# Patient Record
Sex: Male | Born: 2000 | Race: Black or African American | Hispanic: No | Marital: Single | Smoking: Never smoker
Health system: Southern US, Community
[De-identification: ages and names within clinical notes are randomized; demographics above are authoritative.]

## PROBLEM LIST (undated history)

## (undated) DIAGNOSIS — S42409A Unspecified fracture of lower end of unspecified humerus, initial encounter for closed fracture: Secondary | ICD-10-CM

## (undated) DIAGNOSIS — S6990XA Unspecified injury of unspecified wrist, hand and finger(s), initial encounter: Secondary | ICD-10-CM

## (undated) DIAGNOSIS — T1491XA Suicide attempt, initial encounter: Secondary | ICD-10-CM

## (undated) DIAGNOSIS — F1290 Cannabis use, unspecified, uncomplicated: Secondary | ICD-10-CM

## (undated) DIAGNOSIS — F32A Depression, unspecified: Secondary | ICD-10-CM

## (undated) DIAGNOSIS — R4689 Other symptoms and signs involving appearance and behavior: Secondary | ICD-10-CM

## (undated) DIAGNOSIS — F515 Nightmare disorder: Secondary | ICD-10-CM

## (undated) DIAGNOSIS — F329 Major depressive disorder, single episode, unspecified: Secondary | ICD-10-CM

## (undated) HISTORY — DX: Other symptoms and signs involving appearance and behavior: R46.89

## (undated) HISTORY — DX: Unspecified injury of unspecified wrist, hand and finger(s), initial encounter: S69.90XA

## (undated) HISTORY — DX: Major depressive disorder, single episode, unspecified: F32.9

## (undated) HISTORY — DX: Unspecified fracture of lower end of unspecified humerus, initial encounter for closed fracture: S42.409A

## (undated) HISTORY — DX: Suicide attempt, initial encounter: T14.91XA

## (undated) HISTORY — DX: Depression, unspecified: F32.A

## (undated) HISTORY — PX: CIRCUMCISION: SHX1350

## (undated) HISTORY — DX: Cannabis use, unspecified, uncomplicated: F12.90

## (undated) HISTORY — DX: Nightmare disorder: F51.5

---
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
## 2018-05-10 ENCOUNTER — Encounter (HOSPITAL_COMMUNITY): Payer: Self-pay

## 2018-05-10 ENCOUNTER — Emergency Department (HOSPITAL_COMMUNITY)
Admission: EM | Admit: 2018-05-10 | Discharge: 2018-05-11 | Disposition: A | Discharge status: Home / Self Care | Payer: Medicaid Other | Attending: Emergency Medicine | Admitting: Emergency Medicine

## 2018-05-10 DIAGNOSIS — J039 Acute tonsillitis, unspecified: Secondary | ICD-10-CM | POA: Insufficient documentation

## 2018-05-10 DIAGNOSIS — Z79899 Other long term (current) drug therapy: Secondary | ICD-10-CM | POA: Insufficient documentation

## 2018-05-10 DIAGNOSIS — J029 Acute pharyngitis, unspecified: Secondary | ICD-10-CM | POA: Diagnosis present

## 2018-05-10 DIAGNOSIS — Z7982 Long term (current) use of aspirin: Secondary | ICD-10-CM | POA: Diagnosis not present

## 2018-05-10 DIAGNOSIS — Z113 Encounter for screening for infections with a predominantly sexual mode of transmission: Secondary | ICD-10-CM

## 2018-05-10 LAB — URINALYSIS, ROUTINE W REFLEX MICROSCOPIC
BACTERIA UA: NONE SEEN
GLUCOSE, UA: NEGATIVE mg/dL
Hgb urine dipstick: NEGATIVE
KETONES UR: NEGATIVE mg/dL
Leukocytes, UA: NEGATIVE
NITRITE: NEGATIVE
Protein, ur: 100 mg/dL — AB
Specific Gravity, Urine: 1.042 — ABNORMAL HIGH (ref 1.005–1.030)
pH: 5 (ref 5.0–8.0)

## 2018-05-10 LAB — GROUP A STREP BY PCR: Group A Strep by PCR: NOT DETECTED

## 2018-05-10 MED ORDER — LIDOCAINE VISCOUS HCL 2 % MT SOLN
15.0000 mL | Freq: Once | OROMUCOSAL | Status: AC
Start: 2018-05-10 — End: 2018-05-10
  Administered 2018-05-10: 15 mL via OROMUCOSAL
  Filled 2018-05-10: qty 15

## 2018-05-10 MED ORDER — CEFTRIAXONE SODIUM 250 MG IJ SOLR
250.0000 mg | Freq: Once | INTRAMUSCULAR | Status: AC
  Administered 2018-05-10: 250 mg via INTRAMUSCULAR
  Filled 2018-05-10: qty 250

## 2018-05-10 MED ORDER — AZITHROMYCIN 250 MG PO TABS
1000.0000 mg | ORAL_TABLET | Freq: Once | ORAL | Status: AC
  Administered 2018-05-10: 1000 mg via ORAL
  Filled 2018-05-10: qty 4

## 2018-05-10 MED ORDER — STERILE WATER FOR INJECTION IJ SOLN
INTRAMUSCULAR | Status: AC
  Administered 2018-05-10: 23:00:00
  Filled 2018-05-10: qty 10

## 2018-05-10 NOTE — ED Triage Notes (Signed)
Onset 3 days sore throat, white patches on back of throat on right side.

## 2018-05-10 NOTE — ED Provider Notes (Signed)
[facility] EMERGENCY DEPARTMENT Provider Note  CSN: [id number] Arrival date & time: 05/10/18  2022    History   Chief Complaint Chief Complaint  Patient presents with  . Sore Throat    HPI Philip Mccarthy is a 17 y.o. male with no significant medical history who presented to the ED for sore throat. Associated symptoms include right ear pain, pain while swallowing and white spots in throat. Onset of symptoms was 3 days ago, stable since that time.  He is drinking moderate amounts of fluids. Unknown sick contacts. Patient has been taking over-the-counter cold/flu products with no relief. Denies fever, chills, neck pain, skin rash, dysphagia, change in speech, drooling or trismus.  Patient also requests STI testing and treatment. He reports dysuria x3 days. Denies penile discharge, testicle/scrotum pain or swelling, hematuria, urinary urgency/frequency. He admits to unprotected penetrative and oral sex.    Past Medical History:  Diagnosis Date  . Aggressive behavior   . Depression   . Elbow fracture   . Hand injury   . Marijuana use   . Nightmares   . Suicide attempt [facility]) 2014    Patient Active Problem List   Diagnosis Date Noted  . Closed right tarsal navicular fracture 06/12/2016  . Rotator cuff tendonitis 05/15/2016  . Subacromial bursitis 04/24/2016  . Winged scapula of both sides 04/24/2016  . Bilateral shoulder pain 04/24/2016  . 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 Anxiety state 04/08/2016  . Moderate headache 04/08/2016  . Aggressive behavior 02/06/2016  . Self-mutilation 02/06/2016  . Attention deficit hyperactivity disorder (ADHD) 02/06/2016  . PTSD (post-traumatic stress disorder) 02/06/2016    Past Surgical History:  Procedure Laterality Date  . ABDOMINAL HERNIA REPAIR  2007  . CIRCUMCISION    . INGUINAL HERNIA REPAIR Bilateral 2007        Home Medications    Prior to Admission medications     Medication Sig Start Date End Date Taking? Authorizing Provider  albuterol (PROVENTIL HFA;VENTOLIN HFA) 108 (90 Base) MCG/ACT inhaler Inhale 2 puffs into the lungs every 6 (six) hours as needed for wheezing or shortness of breath. 10/22/16   Kirby Crigler, MD  amphetamine-dextroamphetamine (ADDERALL XR) 20 MG 24 hr capsule Take 20 mg by mouth daily.    [provider]  cholecalciferol (VITAMIN D) 1000 units tablet Take 4,000 Units by mouth daily.    [provider]  diclofenac (VOLTAREN) 75 MG EC tablet Take 1 tablet (75 mg total) by mouth 2 (two) times daily as needed. Patient not taking: Reported on 10/22/2016 06/12/16   Chitanand, Alicia B, DO  doxepin (SINEQUAN) 10 MG capsule Take 10 mg by mouth at bedtime.    [provider]  EPINEPHRINE HCL IJ Inject as directed as needed.    [provider]  fluticasone (FLONASE) 50 MCG/ACT nasal spray Place 2 sprays into both nostrils 2 (two) times daily. 10/21/16 11/20/16  Gwenith Daily, MD  gabapentin (NEURONTIN) 300 MG capsule Take 300 mg in a.m., 600 MG in p.m. Patient not taking: Reported on 10/21/2016 06/07/16   Keturah Shavers, MD  lidocaine (XYLOCAINE) 2 % solution Use as directed 15 mLs in the mouth or throat as needed for mouth pain. 05/11/18   Mortis, Jerrel Ivory I, PA-C  lithium carbonate (ESKALITH) 450 MG CR tablet Take 450 mg by mouth 2 (two) times daily.    [provider]  lurasidone (LATUDA) 80 MG TABS tablet Take 80 mg by mouth daily  after supper.    [provider]  Magnesium Oxide 500 MG TABS Take by mouth.    [provider]  Melatonin 5 MG TABS Take by mouth.    [provider]  meloxicam (MOBIC) 7.5 MG tablet Take 1 tablet (7.5 mg total) by mouth daily. Patient not taking: Reported on 10/21/2016 04/24/16   Chitanand, Helmut Muster B, DO  methylphenidate 36 MG PO CR tablet Take 36 mg by mouth daily.    [provider]  naproxen (NAPROSYN) 500 MG tablet Take one pill  twice a day with food for 7 days then as needed Patient not taking: Reported on 10/22/2016 07/29/16   Ralene Cork, DO  prazosin (MINIPRESS) 2 MG capsule Take 4 mg by mouth at bedtime.    [provider]  predniSONE (DELTASONE) 10 MG tablet Take as directed Patient not taking: Reported on 10/21/2016 07/22/16   Ralene Cork, DO  propranolol (INDERAL) 60 MG tablet Take 60 mg by mouth at bedtime.    [provider]  riboflavin (VITAMIN B-2) 100 MG TABS tablet Take 100 mg by mouth daily.    [provider]  tretinoin (RETIN-A) 0.1 % cream Apply topically at bedtime. Patient not taking: Reported on 10/21/2016 03/21/16   Warnell Forester, MD    Family History Family History  Problem Relation Age of Onset  . Asthma Mother   . Hypertension Mother   . Depression Mother   . Bipolar disorder Mother   . Anxiety disorder Mother   . Hypertension Father   . Depression Sister   . Bipolar disorder Sister   . Anxiety disorder Sister   . Diabetes Maternal Aunt   . Lupus Maternal Aunt   . Bipolar disorder Maternal Aunt   . Anxiety disorder Maternal Aunt   . Depression Sister   . Bipolar disorder Sister   . Depression Sister   . Bipolar disorder Maternal Grandmother   . Anxiety disorder Maternal Grandmother     Social History Social History   Tobacco Use  . Smoking status: Never Smoker  . Smokeless tobacco: Never Used  . Tobacco comment: Pt Smokes recreational drugs  Substance Use Topics  . Alcohol use: No    Alcohol/week: 0.0 standard drinks  . Drug use: Yes    Types: Marijuana    Comment: quit a month ago when he was placed in a facility     Allergies   Fish allergy; Shellfish allergy; and Pollen extract   Review of Systems Review of Systems  Constitutional: Negative for chills and fever.  HENT: Positive for ear pain and sore throat. Negative for congestion, drooling, postnasal drip, rhinorrhea, trouble swallowing and voice change.   Eyes: Negative.     Gastrointestinal: Negative for abdominal pain, constipation, diarrhea, nausea and vomiting.  Genitourinary: Positive for dysuria. Negative for discharge, frequency, genital sores, hematuria, penile pain, penile swelling, scrotal swelling, testicular pain and urgency.  Musculoskeletal: Negative for arthralgias.  Skin: Negative for rash.   Physical Exam Updated Vital Signs BP 112/73   Pulse 94   Temp 98.7 F (37.1 C) (Oral)   Resp 16   SpO2 97%   Physical Exam  Constitutional: Vital signs are normal. He appears well-developed and well-nourished. He is cooperative.  HENT:  Right Ear: Tympanic membrane, external ear and ear canal normal.  Left Ear: Tympanic membrane, external ear and ear canal normal.  Mouth/Throat: Uvula is midline, oropharynx is clear and moist and mucous membranes are normal. No oral lesions. No  trismus in the jaw. No uvula swelling. No tonsillar abscesses. Tonsils are 2+ on the right. Tonsillar exudate.  No lesions on oral or buccal mucosa. Tongue normal without plaques or lesions.  Eyes: Conjunctivae and lids are normal.  Neck: Normal range of motion and full passive range of motion without pain. Neck supple. No spinous process tenderness and no muscular tenderness present. Normal range of motion present.  Cardiovascular: Normal rate, regular rhythm and normal heart sounds.  Pulmonary/Chest: Effort normal and breath sounds normal.  Lymphadenopathy:       Head (right side): No submental, no submandibular, no tonsillar, no preauricular, no posterior auricular and no occipital adenopathy present.       Head (left side): No submental, no submandibular, no tonsillar, no preauricular, no posterior auricular and no occipital adenopathy present.    He has no cervical adenopathy.  Neurological: He is alert.  Skin: Skin is warm and intact. Capillary refill takes less than 2 seconds. No rash noted.  Nursing note and vitals reviewed.  ED Treatments / Results  Labs (all labs  ordered are listed, but only abnormal results are displayed) Labs Reviewed  URINALYSIS, ROUTINE W REFLEX MICROSCOPIC - Abnormal; Notable for the following components:      Result Value   Color, Urine AMBER (*)    Specific Gravity, Urine 1.042 (*)    Bilirubin Urine SMALL (*)    Protein, ur 100 (*)    All other components within normal limits  GROUP A STREP BY PCR  CULTURE, GROUP A STREP ([facility])  MONONUCLEOSIS SCREEN  RPR  HIV ANTIBODY (ROUTINE TESTING W REFLEX)  GC/CHLAMYDIA PROBE AMP () NOT AT [facility]    EKG None  Radiology No results found.  Procedures Procedures (including critical care time)  Medications Ordered in ED Medications  lidocaine (XYLOCAINE) 2 % viscous mouth solution 15 mL (15 mLs Mouth/Throat Given 05/10/18 2254)  cefTRIAXone (ROCEPHIN) injection 250 mg (250 mg Intramuscular Given 05/10/18 2253)  azithromycin (ZITHROMAX) tablet 1,000 mg (1,000 mg Oral Given 05/10/18 2253)  sterile water (preservative free) injection (  Given 05/10/18 2259)     Initial Impression / Assessment and Plan / ED Course  Triage vital signs and the nursing notes have been reviewed.  Pertinent labs & imaging results that were available during care of the patient were reviewed and considered in medical decision making (see chart for details).  Patient presents afebrile and is well appearing with complaints of 3 day history of sore throat. Denies known sick contacts, fever, neck pain, dysphagia, drooling, trismus or other symptoms that raise concern for a deep tissue infection or retropharyngeal process. On exam, patient's right tonsil is erythematous and has white exudate on it which is concerning for strep. No abscess or uvula deviation. Patient phonates and breathes well without pain or issue which is reassuring that there is not a retropharyngeal process occurring.   Clinical Course as of May 11 41  Wynelle LinkSun May 10, 2018  2123 Strep test negative. Will order Monospot test as that  is most likely next diagnosis given pt's physical exam and demographic. Also possible that throat pain is due to gonococcal source given pt's concern for STIs. He will receive prophylactic treatment for gonorrhea and chlamydia with Rocephin and Zithromax.   [GM]  Mon May 11, 2018  0003 UA unremarkable.   [GM]  0038 Negative mono. Throat culture pending.   [GM]    Clinical Course User Index [GM] Windy CarinaMortis, Gabrielle I, [place]PA-C   Final Clinical  Impressions(s) / ED Diagnoses  1. Pharyngitis. Likely viral etiology. Strep and mono negative. Education provided on OTC and supportive treatment for relief. Advised on red flag s/s that warrant return to ED vs PCP.  2. STI Screening. Received treated for gonorrhea/chlamydia today. Pending HIV and RPR. Possible gonococcal etiology to pharyngitis.  Dispo: Home. After thorough clinical evaluation, this patient is determined to be medically stable and can be safely discharged with the previously mentioned treatment and/or outpatient follow-up/referral(s). At this time, there are no other apparent medical conditions that require further screening, evaluation or treatment.   Final diagnoses:  Pharyngitis, unspecified etiology  Routine screening for STI (sexually transmitted infection)    ED Discharge Orders         Ordered    lidocaine (XYLOCAINE) 2 % solution  As needed     05/11/18 0041            Mortis, Milroy I, PA-C 05/11/18 0042    Rolan Bucco, MD 05/18/18 [phone]

## 2018-05-11 ENCOUNTER — Encounter (HOSPITAL_COMMUNITY): Payer: Self-pay | Admitting: Radiology

## 2018-05-11 ENCOUNTER — Emergency Department (HOSPITAL_COMMUNITY)
Admission: EM | Admit: 2018-05-11 | Discharge: 2018-05-11 | Disposition: A | Discharge status: Home / Self Care | Payer: Medicaid Other | Source: Home / Self Care | Attending: Emergency Medicine | Admitting: Emergency Medicine

## 2018-05-11 ENCOUNTER — Emergency Department (HOSPITAL_COMMUNITY): Payer: Medicaid Other

## 2018-05-11 DIAGNOSIS — J038 Acute tonsillitis due to other specified organisms: Secondary | ICD-10-CM | POA: Insufficient documentation

## 2018-05-11 DIAGNOSIS — B9689 Other specified bacterial agents as the cause of diseases classified elsewhere: Secondary | ICD-10-CM

## 2018-05-11 LAB — HIV ANTIBODY (ROUTINE TESTING W REFLEX): HIV SCREEN 4TH GENERATION: NONREACTIVE

## 2018-05-11 LAB — MONONUCLEOSIS SCREEN: MONO SCREEN: NEGATIVE

## 2018-05-11 LAB — GC/CHLAMYDIA PROBE AMP (~~LOC~~) NOT AT ARMC
Chlamydia: POSITIVE — AB
Neisseria Gonorrhea: NEGATIVE

## 2018-05-11 LAB — RPR: RPR: NONREACTIVE

## 2018-05-11 MED ORDER — CLINDAMYCIN HCL 300 MG PO CAPS: 300.0000 mg | ORAL_CAPSULE | Freq: Three times a day (TID) | ORAL | 0 refills | Status: AC

## 2018-05-11 MED ORDER — LIDOCAINE VISCOUS HCL 2 % MT SOLN: 15.0000 mL | Freq: Once | OROMUCOSAL | Status: DC

## 2018-05-11 MED ORDER — IOHEXOL 300 MG/ML  SOLN
75.0000 mL | Freq: Once | INTRAMUSCULAR | Status: AC | PRN
  Administered 2018-05-11: 75 mL via INTRAVENOUS

## 2018-05-11 MED ORDER — DEXAMETHASONE 4 MG PO TABS
10.0000 mg | ORAL_TABLET | Freq: Once | ORAL | Status: AC
  Administered 2018-05-11: 10 mg via ORAL
  Filled 2018-05-11: qty 2

## 2018-05-11 MED ORDER — LIDOCAINE VISCOUS HCL 2 % MT SOLN: 15.0000 mL | OROMUCOSAL | 0 refills | Status: AC | PRN

## 2018-05-11 NOTE — ED Provider Notes (Signed)
[facility]-EMERGENCY DEPT Provider Note   CSN: [id number] Arrival date & time: 05/11/18  1147     History   Chief Complaint Chief Complaint  Patient presents with  . Sore throat    HPI Philip Mccarthy is a 17 y.o. male who presents to the ED for sore throat. Patient was evaluated last night at [facility] for same. Associated symptoms include right ear pain, pain while swallowing and white spots in throat. Onset of symptoms was 4 days ago.  He is drinking moderate amounts of fluids. Unknown sick contacts. Patient has been taking over-the-counter cold/flu products with no relief. Denies fever, chills, neck pain, skin rash, dysphagia, change in speech, drooling or trismus. Patient also reported dysuria last night and requested STD screening and treatment. Patient was negative for strep and mono last night.  HPI  Past Medical History:  Diagnosis Date  . Aggressive behavior   . Depression   . Elbow fracture   . Hand injury   . Marijuana use   . Nightmares   . Suicide attempt [facility]) 2014    Patient Active Problem List   Diagnosis Date Noted  . Closed right tarsal navicular fracture 06/12/2016  . Rotator cuff tendonitis 05/15/2016  . Subacromial bursitis 04/24/2016  . Winged scapula of both sides 04/24/2016  . Bilateral shoulder pain 04/24/2016  . 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 Anxiety state 04/08/2016  . Moderate headache 04/08/2016  . Aggressive behavior 02/06/2016  . Self-mutilation 02/06/2016  . Attention deficit hyperactivity disorder (ADHD) 02/06/2016  . PTSD (post-traumatic stress disorder) 02/06/2016    Past Surgical History:  Procedure Laterality Date  . ABDOMINAL HERNIA REPAIR  2007  . CIRCUMCISION    . INGUINAL HERNIA REPAIR Bilateral 2007        Home Medications    Prior to Admission medications   Medication Sig Start Date End Date Taking? Authorizing Provider    Aspirin-Acetaminophen-Caffeine (EXCEDRIN PO) Take 2 tablets by mouth every 6 (six) hours as needed (headache).   Yes [provider]  Phenylephrine-DM-GG-APAP (TYLENOL COLD/FLU SEVERE PO) Take 2 capsules by mouth daily as needed (cold symptoms).   Yes [provider]  albuterol (PROVENTIL HFA;VENTOLIN HFA) 108 (90 Base) MCG/ACT inhaler Inhale 2 puffs into the lungs every 6 (six) hours as needed for wheezing or shortness of breath. Patient not taking: Reported on 05/11/2018 10/22/16   Kirby Crigler, MD  clindamycin (CLEOCIN) 300 MG capsule Take 1 capsule (300 mg total) by mouth 3 (three) times daily. 05/11/18   Janne Napoleon, NP  diclofenac (VOLTAREN) 75 MG EC tablet Take 1 tablet (75 mg total) by mouth 2 (two) times daily as needed. Patient not taking: Reported on 10/22/2016 06/12/16   Chitanand, Alicia B, DO  fluticasone (FLONASE) 50 MCG/ACT nasal spray Place 2 sprays into both nostrils 2 (two) times daily. Patient not taking: Reported on 05/11/2018 10/21/16 11/20/16  Gwenith Daily, MD  gabapentin (NEURONTIN) 300 MG capsule Take 300 mg in a.m., 600 MG in p.m. Patient not taking: Reported on 10/21/2016 06/07/16   Keturah Shavers, MD  lidocaine (XYLOCAINE) 2 % solution Use as directed 15 mLs in the mouth or throat as needed for mouth pain. Patient not taking: Reported on 05/11/2018 05/11/18   Mortis, Jerrel Ivory I, PA-C  meloxicam (MOBIC) 7.5 MG tablet Take 1 tablet (7.5 mg total) by mouth daily. Patient not taking: Reported on 10/21/2016 04/24/16   Chitanand, Helmut Muster B, DO  naproxen (NAPROSYN)  500 MG tablet Take one pill twice a day with food for 7 days then as needed Patient not taking: Reported on 10/22/2016 07/29/16   Ralene Cork, DO  predniSONE (DELTASONE) 10 MG tablet Take as directed Patient not taking: Reported on 10/21/2016 07/22/16   Ralene Cork, DO  tretinoin (RETIN-A) 0.1 % cream Apply topically at bedtime. Patient not taking: Reported on 10/21/2016 03/21/16   Warnell Forester,  MD    Family History Family History  Problem Relation Age of Onset  . Asthma Mother   . Hypertension Mother   . Depression Mother   . Bipolar disorder Mother   . Anxiety disorder Mother   . Hypertension Father   . Depression Sister   . Bipolar disorder Sister   . Anxiety disorder Sister   . Diabetes Maternal Aunt   . Lupus Maternal Aunt   . Bipolar disorder Maternal Aunt   . Anxiety disorder Maternal Aunt   . Depression Sister   . Bipolar disorder Sister   . Depression Sister   . Bipolar disorder Maternal Grandmother   . Anxiety disorder Maternal Grandmother     Social History Social History   Tobacco Use  . Smoking status: Never Smoker  . Smokeless tobacco: Never Used  . Tobacco comment: Pt Smokes recreational drugs  Substance Use Topics  . Alcohol use: No    Alcohol/week: 0.0 standard drinks  . Drug use: Yes    Types: Marijuana    Comment: quit a month ago when he was placed in a facility     Allergies   Shellfish allergy and Pollen extract   Review of Systems Review of Systems  Constitutional: Positive for chills and fever.  HENT: Positive for ear pain, sore throat and trouble swallowing.   Eyes: Negative for visual disturbance.  Respiratory: Negative for cough and shortness of breath.   Cardiovascular: Negative for chest pain.  Gastrointestinal: Negative for abdominal pain, nausea and vomiting.  Genitourinary: Positive for dysuria.  Musculoskeletal: Positive for myalgias.  Skin: Negative for rash.  Neurological: Negative for light-headedness.  Psychiatric/Behavioral: Negative for confusion.     Physical Exam Updated Vital Signs BP (!) 132/58 (BP Location: Right Arm)   Pulse 90   Temp 100 F (37.8 C)   Resp 15   SpO2 100%   Physical Exam  Constitutional: He appears well-developed and well-nourished. No distress.  HENT:  Head: Normocephalic.  Mouth/Throat: Uvula is midline. No trismus in the jaw. Posterior oropharyngeal edema and posterior  oropharyngeal erythema present. Tonsils are 4+ on the right. Tonsillar exudate.  Patient spitting in the trash can.  Eyes: EOM are normal.  Neck: Neck supple.  Cardiovascular: Normal rate.  Pulmonary/Chest: Effort normal.  Musculoskeletal: Normal range of motion.  Neurological: He is alert.  Skin: Skin is warm and dry.  Psychiatric: He has a normal mood and affect.  Nursing note and vitals reviewed.    ED Treatments / Results  Labs (all labs ordered are listed, but only abnormal results are displayed) Labs Reviewed - No data to display Radiology Ct Soft Tissue Neck W Contrast  Result Date: 05/11/2018 CLINICAL DATA:  Throat pain, RIGHT throat swelling. EXAM: CT NECK WITH CONTRAST TECHNIQUE: Multidetector CT imaging of the neck was performed using the standard protocol following the bolus administration of intravenous contrast. CONTRAST:  75mL OMNIPAQUE IOHEXOL 300 MG/ML  SOLN COMPARISON:  None. FINDINGS: PHARYNX AND LARYNX: Mild striated enhancement palatine tonsils. Mildly enlarged lingual tonsils. Mildly effaced RIGHT parapharyngeal fat plane. Widely  patent airway. SALIVARY GLANDS: Normal. THYROID: Normal. LYMPH NODES: No lymphadenopathy by CT size criteria. VASCULAR: Normal. LIMITED INTRACRANIAL: Normal. VISUALIZED ORBITS: Normal. MASTOIDS AND VISUALIZED PARANASAL SINUSES: Mild paranasal sinus mucosal thickening without air-fluid levels. Included mastoid air cells are well aerated. SKELETON: Nonacute. UPPER CHEST: Lung apices are clear. No superior mediastinal lymphadenopathy. OTHER: None. IMPRESSION: 1. Mild acute tonsillitis without peritonsillar abscess. Patent airway. 2. Mild paranasal sinusitis. Electronically Signed   By: Awilda Metroourtnay  Bloomer M.D.   On: 05/11/2018 15:01    Procedures Procedures (including critical care time)  Medications Ordered in ED Medications  dexamethasone (DECADRON) tablet 10 mg (10 mg Oral Given 05/11/18 1521)  iohexol (OMNIPAQUE) 300 MG/ML solution 75 mL (75  mLs Intravenous Contrast Given 05/11/18 1430)     Initial Impression / Assessment and Plan / ED Course  I have reviewed the triage vital signs and the nursing notes.   Pt febrile with tonsillar exudate, cervical lymphadenopathy, & dysphagia; diagnosis of tonsillitis. Treated in the ED with steroids, CT scan shows tonsillitis but no abscess or obstruction. First dose of Clindamycin given. [facility]  Pt appears mildly dehydrated, discussed importance of water rehydration. No trismus or uvula deviation. Specific return precautions discussed. Pt able to drink water in ED without difficulty with intact air way.   Final Clinical Impressions(s) / ED Diagnoses   Final diagnoses:  Acute bacterial tonsillitis    ED Discharge Orders         Ordered    clindamycin (CLEOCIN) 300 MG capsule  3 times daily     05/11/18 1526           Damian Leavelleese, [place] M, [place]NP 05/11/18 2228    Marily MemosMesner, Jason, MD 05/12/18 [phone]

## 2018-05-11 NOTE — ED Triage Notes (Signed)
Per EMS, pt is coming from his mom's job stating he is having throat pain and swelling to right tonsil. [facility]. Pt stated he was negative for strep and mono. Pt has a hx of depression.

## 2018-05-11 NOTE — Discharge Instructions (Addendum)
Your strep and mono test came back negative. I am not sure what is causing your sore throat, but it is likely a virus. No antibiotics are needed today. Your symptoms should begin to get better over the next 3-5 days. Continue using over-the-counter cold and flu products. I have also prescribed you lidocaine solution that will help with the pain.  You have been treated for gonorrhea and chlamydia today. HIV and syphilis does not come back today. If they are positive, you will receive a call from us in 24-48 hours.  I hope you begin to feel better soon.

## 2018-05-11 NOTE — Discharge Instructions (Addendum)
Take the medication as directed and follow up with your primary care doctor. If symptoms are not improving follow up with Dr. Annalee GentaShoemaker. If symptoms worsen, return here.

## 2018-05-12 NOTE — Progress Notes (Signed)
Attempted to contact patient but mailbox was full.

## 2018-05-13 ENCOUNTER — Other Ambulatory Visit: Payer: Self-pay | Admitting: Pediatrics

## 2018-05-13 DIAGNOSIS — A749 Chlamydial infection, unspecified: Secondary | ICD-10-CM

## 2018-05-13 MED ORDER — AZITHROMYCIN 500 MG PO TABS: 1000.0000 mg | ORAL_TABLET | Freq: Once | ORAL | 0 refills | Status: AC

## 2018-05-13 NOTE — Progress Notes (Signed)
Notified patient of results. He would like RX sent to pharmacy [facility] [address]
